# Patient Record
Sex: Female | Born: 1995 | Race: White | Hispanic: No | Marital: Single | Smoking: Never smoker
Health system: Southern US, Community
[De-identification: ages and names within clinical notes are randomized; demographics above are authoritative.]

## PROBLEM LIST (undated history)

## (undated) DIAGNOSIS — F71 Moderate intellectual disabilities: Secondary | ICD-10-CM

## (undated) DIAGNOSIS — R569 Unspecified convulsions: Secondary | ICD-10-CM

## (undated) DIAGNOSIS — F909 Attention-deficit hyperactivity disorder, unspecified type: Secondary | ICD-10-CM

---
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
## 2023-03-14 ENCOUNTER — Emergency Department (HOSPITAL_BASED_OUTPATIENT_CLINIC_OR_DEPARTMENT_OTHER)
Admission: EM | Admit: 2023-03-14 | Discharge: 2023-03-14 | Disposition: A | Discharge status: Home / Self Care | Payer: No Typology Code available for payment source | Attending: Emergency Medicine | Admitting: Emergency Medicine

## 2023-03-14 ENCOUNTER — Emergency Department (HOSPITAL_BASED_OUTPATIENT_CLINIC_OR_DEPARTMENT_OTHER): Payer: No Typology Code available for payment source

## 2023-03-14 ENCOUNTER — Other Ambulatory Visit: Payer: Self-pay

## 2023-03-14 DIAGNOSIS — M62838 Other muscle spasm: Secondary | ICD-10-CM | POA: Insufficient documentation

## 2023-03-14 DIAGNOSIS — Y92410 Unspecified street and highway as the place of occurrence of the external cause: Secondary | ICD-10-CM | POA: Insufficient documentation

## 2023-03-14 DIAGNOSIS — R519 Headache, unspecified: Secondary | ICD-10-CM | POA: Diagnosis present
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
## 2023-09-16 NOTE — Progress Notes (Signed)
 Medicare AWV  Carolyn Page is a 28 y.o. female who presents for her subsequent annual wellness visit for Medicare.  Clinical documentation was reviewed and is accessible via encounter-level attachments.    Any physical exam components or additional concerns beyond the scope of the Annual Wellness Visit may be documented in a separate note within this encounter.  Medicare Required Components     Reviewed and updated this visit by provider: Tobacco  Allergies  Meds  Problems  Med Hx  Surg Hx  Fam Hx       Substance Use Disorder Risk Statement: Low Substance Use Disorder / Overdose risk - Risk factors were reviewed and patient is low risk   WHISPER TEST: Normal bilaterally   Patient Care Team: Tylene KATHEE Meres, PA-C as PCP - General (Family Medicine) Mojeed A Akintayo as Consulting Physician (Psychiatry)  Vitals   Vitals:   09/16/23 0737  BP: 100/80  Patient Position: Sitting  Pulse: 76  Temp: 97.5 F (36.4 C)  TempSrc: Oral  Resp: 20  Height: 5' 3 (1.6 m)  Weight: 150 lb 3.2 oz (68.1 kg)  SpO2: 98%  BMI (Calculated): 26.6  PainSc: 0-No pain     General: The patient is a well-developed, well-nourished 28 y.o. female who appears to be in no acute distress. Alert and oriented x 3. HEENT: Normocephalic, atraumatic, non-icteric sclera, PERRL, EOMI. EACs are patent and normal. Tympanic membranes are clear bilaterally without perforation or infection.  Nasopharynx is grossly normal.  Oropharynx is without mass, erythema or exudate. Neck:  Supple. Trachea is in midline position. The neck is without adenopathy, masses, or thyromegaly. No carotid bruits audible. Lungs:  Good breath sounds are noted bilaterally. The lungs are clear to auscultation bilaterally without rales, rhonchi or wheezing.   Cardio:  Regular rate and rhythm without gallop, rub, or murmur. Normal S1, S2. Abdomen:  Soft and nontender to palpation.  No masses or organomegaly are noted.  Skin:  Warm and dry.  No rashes or worrisome lesions are noted.  Extremities:  The extremities are without cyanosis or significant contusions.  Pitting edema is not noted.  ROM is normal in all four extremities. Peripheral pulses are 2+ and symmetric in upper and lower extremities. Neuro:  Mental status is normal.  CN 2-11 are grossly normal.  Motor and sensory exams are grossly normal.  DTR's 2+ in all extremities.  Gait is stable. Psych: Her mood and affect are normal.  Disposition   1. Medicare annual wellness visit, subsequent (Primary) 2. Grand mal seizure (*) -     CMP14+CBC/D/Plt+TSH; Future 3. Microcephaly (*) -     CMP14+CBC/D/Plt+TSH; Future 4. Long-term use of high-risk medication -     CMP14+CBC/D/Plt+TSH; Future 5. Screening for deficiency anemia -     CMP14+CBC/D/Plt+TSH; Future 6. Screening for lipid disorders -     Lipid Panel With LDL/HDL Ratio; Future 7. Vitamin D deficiency -     Vitamin D 25 Hydroxy; Future 8. BMI 26.0-26.9,adult -     CMP14+CBC/D/Plt+TSH; Future    Health maintenance/health promotion issues appropriate to age reviewed. Immunizations are current. Recommend healthy diet and regular exercise. Recommend self breast exams.   Discussed pap recommendation with pt's caretaker and she does not feel that this is necessary as pt has never been sexually active - I agree. Recommend multivitamin daily and vitamin d supplementation. Cardiovascular screening with blood pressure and blood work (CMP, Lipid panel). Diabetes screening with fasting glucose in those patients who are not diabetic. Depression  Screening/Awareness.    Fasting blood work pending.   Continue current meds and follow-up with psych.  Encouraged healthy, low fat, low carb diet and regular exercise such as walking to keep BMI in healthy range.  I have reviewed the information contained in this note and personally verified its accuracy.  I obtained the history of present illness and personally performed the  physical exam.  Patient's caretaker verbalized to me that they understood what their problem is, what they need to do about it and why it is important that they do it.  Follow up for recheck as needed.   Health maintenance issues were discussed with the patient.  A written plan was provided to the patient in the form of patient instructions in the After Visit Summary document.

## 2024-05-23 ENCOUNTER — Ambulatory Visit (HOSPITAL_COMMUNITY): Admission: EM | Admit: 2024-05-23 | Discharge: 2024-05-23 | Disposition: A | Discharge status: Home / Self Care

## 2024-05-23 DIAGNOSIS — R4689 Other symptoms and signs involving appearance and behavior: Secondary | ICD-10-CM

## 2024-05-23 DIAGNOSIS — Z79899 Other long term (current) drug therapy: Secondary | ICD-10-CM | POA: Insufficient documentation

## 2024-05-23 DIAGNOSIS — R451 Restlessness and agitation: Secondary | ICD-10-CM | POA: Insufficient documentation

## 2024-05-23 DIAGNOSIS — F331 Major depressive disorder, recurrent, moderate: Secondary | ICD-10-CM | POA: Insufficient documentation

## 2024-05-23 DIAGNOSIS — F79 Unspecified intellectual disabilities: Secondary | ICD-10-CM | POA: Diagnosis present

## 2024-05-23 NOTE — ED Notes (Signed)
 Patient discharged home per MD order. After Visit Summary (AVS) printed and given to caregiver. AVS reviewed with patient and all questions fully answered. Patient discharged in no acute distress, A&O x4 and ambulatory. Patient denied SI/HI, A/VH upon discharge. Caregiver verbalized understanding of all discharge instructions explained by staff. Patient mood fair. Patient belongings returned to patient from locker complete and intact. Patient escorted to lobby via staff for transport to destination. Safety maintained.

## 2024-05-23 NOTE — BH Assessment (Addendum)
 Comprehensive Clinical Assessment (CCA) Note  05/23/2024 Carolyn Page [id number]  Disposition: Per Carolyn Snell, NP patient does not meet inpatient criteria.  She will continue with Day Treatment and f/u with Carolyn Page soon.   The patient demonstrates the following risk factors for suicide: Chronic risk factors for suicide include: psychiatric disorder of MDD and IDD. Acute risk factors for suicide include: social withdrawal/isolation and loss (financial, interpersonal, professional). Protective factors for this patient include: positive social support, positive therapeutic relationship, hope for the future, and life satisfaction. Considering these factors, the overall suicide risk at this point appears to be low. Patient is appropriate for outpatient follow up.  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 Patient presents with BHRT from her ALF after she refused to go to her Day Program today.  She has refused recently and endorses SI to try to avoid attending.  She denies current SI, and continually asks, Can I go home?  While denying SI, she does mention wanting to harm Carolyn Page.  She struggles to describe her relationship to Carolyn Page, at one point mentioning she works at her ALF and then describes her as another client at the [employer]  She has no plan or intent.  She shares she got upset because she did not want to go to [place] (Day Tx) and states he began screaming and yelling.  She indicates she pulled out a knife and she gestures as if she threatened others in the car, however this was not reported by BHRT or [employer]. Patient is IDD and struggles to engage in assessment.  Per BHRT,  AFL staff, Carolyn Page, is currently filing IVC paperwork.  Patient's ALF director, Carolyn Page, was interviewed.  She reports she has had guardianship of patient for  the past 15 years.  She states patient seems to have attachment issues, as she often struggles to leave her side when she takes patient to Day Treatment.  Carolyn believes this may happen more often when patient is aware of the other resident having plans with staff while patient is at the Day program.  Carolyn states she is tired of calling [employer], when patient refuses to go and acts out.  Carolyn states patient has no access to knives and did not have any knife in the car as patient reported.  She reports patient sees Carolyn Page for med management and she has been encouraged by provider to reach out to him regarding possible medication adjustments/ changes.  Carolyn also plans to work with the day program to assist her with the transition when she tries to drop patient off each day.    Provider spoke with patient's legal guardian, Carolyn Page [phone], who was hoping patient could be admitted.  He was informed by provider that patient does not meet criteria for admission.     Chief Complaint: No chief complaint on file.  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 Legal System  What Is the Reason for Your Visit/Call Today? Patient presents with BHRT from her ALF after she refused to go to her Day  Program today.  She has refused recently and endorses SI to try to avoid attending.  She denies current SI, and continually asks, Can I go home?  While denying SI, she does mention wanting to harm Carolyn Page.  She struggles to describe her relationship to Carolyn Page, at one point mentioning she works at her ALF and then describes her as another client at the [employer]  She has no plan or intent.  She shares she got upset because she did not want to go to [place] (Day Tx) and states he began screaming and yelling.  She indicates she pulled out a  knife and she gestures as if she threatened others in the car, however this was not reported by BHRT or [employer]PD. Patient is IDD and struggles to engage in assessment.  Per BHRT, Legal Guardian, Carolyn Page, is currently filing IVC paperwork.  How Long Has This Been Causing You Problems? 1 wk - 1 month  What Do You Feel Would Help You the Most Today? Treatment for Depression or other mood problem   Have You Recently Had Any Thoughts About Hurting Yourself? Yes  Are You Planning to Commit Suicide/Harm Yourself At This time? No   Flowsheet Row ED from 03/14/2023 in [facility] at [facility]  C-SSRS RISK CATEGORY No Risk    Have you Recently Had Thoughts About Hurting Someone [name]? Yes  Are You Planning to Harm Someone at This Time? No  Explanation: N/A   Have You Used Any Alcohol or Drugs in the Past 24 Hours? No  How Long Ago Did You Use Drugs or Alcohol? N/A What Did You Use and How Much? N/A  Do You Currently Have a Therapist/Psychiatrist? Yes  Name of Therapist/Psychiatrist: Name of Therapist/Psychiatrist: Patient is followed by Carolyn Page for med management.   Have You Been Recently Discharged From Any Office Practice or Programs? No  Explanation of Discharge From Practice/Program: N/A    CCA Screening Triage Referral Assessment Type of Contact: Face-to-Face  Telemedicine Service Delivery:   Is this Initial or Reassessment?   Date Telepsych consult ordered in [facility]:    Time Telepsych consult ordered in [facility]:    Location of Assessment: [facility] Services  Provider Location: [facility] Assessment Services   Collateral Involvement: Carolyn Page, caretaker (ALF owner) provided collateral.   Does Patient Have a Court Appointed Legal Guardian? Yes Other: (LG with [facility])  Legal Guardian Contact Information: Carolyn Page with [facility] [phone]  Copy of Legal Guardianship Form: No - copy requested  Legal  Guardian Notified of Arrival: Successfully notified  Legal Guardian Notified of Pending Discharge: Successfully notified  If Minor and Not Living with Parent(s), Who has Custody? N/A  Is CPS involved or ever been involved? In the Past  Is APS involved or ever been involved? Never   Patient Determined To Be At Risk for Harm To Self or Others Based on Review of Patient Reported Information or Presenting Complaint? No  Method: -- (N/A, no HI)  Availability of Means: -- (N/A, no HI)  Intent: -- (N/A, no HI)  Notification Required: -- (N/A, no HI)  Additional Information for Danger to Others Potential: -- (N/A, no HI)  Additional Comments for Danger to Others Potential: N/A, no HI  Are There Guns or Other Weapons in Your Home? No  Types of Guns/Weapons: N/A  Are These Weapons Safely Secured?                            -- (  N/A)  Who Could Verify You Are Able To Have These Secured: N/A  Do You Have any Outstanding Charges, Pending Court Dates, Parole/Probation? None  Contacted To Inform of Risk of Harm To Self or Others: Law Enforcement    Does Patient Present under Involuntary Commitment? Yes    [place] of Residence: [place]   Patient Currently Receiving the Following Services: Day Treatment; Medication Management   Determination of Need: Urgent (48 hours)   Options For Referral: Medication Management; Outpatient Therapy     CCA Biopsychosocial Patient Reported Schizophrenia/Schizoaffective Diagnosis in Past: No   Strengths: Patient has a supportive ALF staff and she is engaged in outpt services and Day Treatment.   Mental Health Symptoms Depression:  Irritability   Duration of Depressive symptoms: Duration of Depressive Symptoms: Greater than two weeks   Mania:  None   Anxiety:   Worrying; Tension   Psychosis:  None   Duration of Psychotic symptoms:    Trauma:  None   Obsessions:  None   Compulsions:  None   Inattention:  N/A    Hyperactivity/Impulsivity:  N/A   Oppositional/Defiant Behaviors:  Argumentative; Temper   Emotional Irregularity:  Mood lability   Other Mood/Personality Symptoms:  Low frustration tolerance consistent with IDD    Mental Status Exam Appearance and self-care  Stature:  Average   Weight:  Overweight   Clothing:  Casual   Grooming:  Normal   Cosmetic use:  None   Posture/gait:  Normal   Motor activity:  Not Remarkable   Sensorium  Attention:  Normal   Concentration:  Scattered   Orientation:  Person (d/t coginitive impairment)   Recall/memory:  Defective in Short-term (likely related to IDD)   Affect and Mood  Affect:  Appropriate   Mood:  -- (calm)   Relating  Eye contact:  Normal   Facial expression:  Responsive   Attitude toward examiner:  Cooperative   Thought and Language  Speech flow: Clear and Coherent   Thought content:  Appropriate to Mood and Circumstances   Preoccupation:  None   Hallucinations:  None   Organization:  Passenger transport manager of Knowledge:  Average   Intelligence:  Average   Abstraction:  Concrete   Judgement:  Impaired (secondary to cognitive impairment)   Reality Testing:  Unaware (cognitive impairment)   Insight:  Lacking; Poor (secondary cognitive impairment)   Decision Making:  Only simple   Social Functioning  Social Maturity:  Self-centered   Social Judgement:  Naive   Stress  Stressors:  Transitions   Coping Ability:  Exhausted   Skill Deficits:  Activities of daily living; Communication; Decision making; Intellect/education; Interpersonal; Self-control   Supports:  Family; Friends/Service system     Religion: Religion/Spirituality Are You A Religious Person?: No How Might This Affect Treatment?: N/A  Leisure/Recreation: Leisure / Recreation Do You Have Hobbies?: Yes Leisure and Hobbies: Patient states she likes to play with toys and with  dolls.  Exercise/Diet: Exercise/Diet Do You Exercise?: No Have You Gained or Lost A Significant Amount of Weight in the Past Six Months?: No Do You Follow a Special Diet?: No Do You Have Any Trouble Sleeping?: No   CCA Employment/Education Employment/Work Situation: Employment / Work Systems developer: On disability Why is Patient on Disability: IDD How Long has Patient Been on Disability: unknown Patient's Job has Been Impacted by Current Illness:  (n/a)  Education: Education Is Patient Currently Attending School?: No Last Grade Completed:  (UTA) Did  You Attend College?: No Did You Have An Individualized Education Program (IIEP): Yes Did You Have Any Difficulty At School?: Yes Were Any Medications Ever Prescribed For These Difficulties?: Yes Medications Prescribed For School Difficulties?: unknown Patient's Education Has Been Impacted by Current Illness: No   CCA Family/Childhood History Family and Relationship History: Family history Marital status: Single Does patient have children?: No  Childhood History:  Childhood History By whom was/is the patient raised?:  (Unknown, with foster care since age 61, now FP is ALF director for her ALF where pt resides) Did patient suffer any verbal/emotional/physical/sexual abuse as a child?: No Did patient suffer from severe childhood neglect?: Yes Patient description of severe childhood neglect: unknown, in foster care since at least age 29 per 55. Has patient ever been sexually abused/assaulted/raped as an adolescent or adult?: No Was the patient ever a victim of a crime or a disaster?:  (UTA) Witnessed domestic violence?: No Has patient been affected by domestic violence as an adult?: No       CCA Substance Use Alcohol/Drug Use: Alcohol / Drug Use Pain Medications: See MAR Prescriptions: See MAR Over the Counter: See MAR History of alcohol / drug use?: No history of alcohol / drug abuse                          ASAM's:  Six Dimensions of Multidimensional Assessment  Dimension 1:  Acute Intoxication and/or Withdrawal Potential:      Dimension 2:  Biomedical Conditions and Complications:      Dimension 3:  Emotional, Behavioral, or Cognitive Conditions and Complications:     Dimension 4:  Readiness to Change:     Dimension 5:  Relapse, Continued use, or Continued Problem Potential:     Dimension 6:  Recovery/Living Environment:     ASAM Severity Score:    ASAM Recommended Level of Treatment:     Substance use Disorder (SUD)    Recommendations for Services/Supports/Treatments:    Disposition Recommendation per psychiatric provider: There are no psychiatric contraindications to discharge at this time   DSM5 Diagnoses: Patient Active Problem List   Diagnosis Date Noted   Aggressive behavior 05/16/2014   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

## 2024-05-23 NOTE — ED Provider Notes (Signed)
 Behavioral Health Urgent Care Medical Screening Exam  Patient Name: Carolyn Page MRN: [id number] Date of Evaluation: 05/23/24 Chief Complaint:  SI & HI while at day program Diagnosis:  Final diagnoses:  Behavioral problems   History of Present illness: Carolyn Page is a 27 y.o. female with a history of IDD who presented to the [facility] accompanied by the behavioral health response team due to making threats to harm people at her day program.  Assessment: During assessment, patient denies suicidal ideations, denies homicidal ideations, denies auditory of visual hallucinations. Patient has no history of harming other people, and these behaviors are only happening at the day program as per collateral information obtained from the AFL provider Dorma Riding). AFL provider further denies that respondent had a knife or has access to a knife, and feels comfortable with patient returning home to her. She has been educated on the need to reach out to the outpatient psychiatrist (Dr Sable) for possible medication adjustments, as patient is on a lot of medications and there is a lot of room for adjustments. Medications include: Clonidine  0.1 mg 3 times daily, Lamictal  100 mg twice daily, naltrexone 50 mg twice daily, Inderal  80 mg daily, Zyprexa 15 mg twice daily, Saphris 5 mg twice daily, Topamax 100 mg twice daily.  List of medication is provided by ALF worker.   Safety planning has been completed with AFL provider, and she feels comfortable taking her back home. Respondent also has wrap around services already in place in the community, and will not benefit from an inpatient hospitalization type of setting at this time. Writer Educated AFL worker to return to this location should respondent's behaviors reoccur.  Writer educated patient's legal guardian regarding the fact that patient will not benefit from an inpatient hospitalization at this time, as per assessment completed.  He has been notified that  currently, patient is not presenting a danger to herself, or anybody else, as she is currently denying SI, denies HI, denies AVH.  Writer also educated on the fact that we cannot guarantee that behaviors were not reoccur, but in the event that they reoccur, to return patient back to this facility.    Patient currently has wraparound services, including one-on-one worker in the community, which is the highest level of care available in the community. Pt was petitioned for involuntary commitment by his AFL worker, but this has been rescinded as pt is currently not presenting a danger to herself or any one else in the community.  Safety planning completed with AFL worker-Stacey [phone]  Discussed methods to reduce the risk of self-injury or suicide attempts: Frequent conversations regarding unsafe thoughts. Locking/monitoring the use of all significant sharps, including knives, razor blades, pencil sharpener razors. If there is a firearm in the home, keeping the firearm unloaded, locking the firearm, locking the ammunition separately from the firearm, preventing access to the firearm and the ammunition. Locking/monitoring the use of medications, including over-the-counter medications and supplements. Having a responsible person dispense medications until patient has strengthened coping skills. Room checks for sharps or other harmful objects. Secure all chemical substances that can be ingested or inhaled. Securing any ligature risks. Calling 911/EMS or going to the nearest emergency room for any worsening of condition.   [place] Suicide Risk assessment:  1. Do you wish to be dead? NO 2. Have you wished your dead or wished you could go to sleep and not wake up?  NO 3.  Have you actually had thoughts of killing yourself?  NO 4.  Have you been thinking about how you might do this?   NO 5.  Have you had these thoughts and some intention of acting on them?  NO 6.  Have you started to work out or  worked out the details to kill yourself?  NO 7.  Do you intend to carry out this plan? NO 8. On a scale of 1-5 with 1 being the least severe and 5 being the most severe answer the following questions place for intensity of ideation. ZERO 9. How many times have you had these thoughts? NO 10. When you have the thoughts how long to the last?  NO 11. Control ability.  Could you or can you stop thinking about killing herself or wanting to die if you want to?  YES 12. Are there any things anyone or anything family religion pain of death that stop you from wanting to die or acting on thoughts of committing suicide?  FAMILY 13.  What sort of reason to do have to think about wanting to die or killing yourself? NONE  14. Have you done anything, started to do anything,  or prepared to do anything to end your life? Examples: Took pills, tried to shoot yourself, cut yourself, tried to hang yourself,  took out pills but didn't swallow any, held a gun but changed your mind or it was  grabbed from your hand, went to the roof but didn't jump, collected pills, obtained  a gun, gave away valuables, wrote a will or suicide note, etc. NO.  Suicide Risk: Minimal: No identifiable suicidal ideation.  Patients presenting with mild risk factors but with morbid ruminations; may be classified as minimal risk based on the severity of the depressive symptoms. Denies past suicide attempts, none per AFL worker, no past mental health related hospitalizations.  Flowsheet Row ED from 03/14/2023 in [facility] Emergency Department at [facility]  C-SSRS RISK CATEGORY No Risk    Psychiatric Specialty Exam  Presentation  General Appearance:Fairly Groomed  Eye Contact:Fair  Speech:Clear and Coherent  Speech Volume:Normal  Handedness:Right  Mood and Affect  Mood:Depressed; Anxious  Affect:Congruent   Thought Process  Thought Processes:Coherent  Descriptions of Associations:Intact  Orientation:Full (Time,  Place and Person)  Thought Content:Logical  Diagnosis of Schizophrenia or Schizoaffective disorder in past: No   Hallucinations:None  Ideas of Reference:None  Suicidal Thoughts:No  Homicidal Thoughts:No   Sensorium  Memory:Immediate Fair  Judgment:Fair  Insight:Fair  Executive Functions  Concentration:Fair  Attention Span:Fair  Recall:Fair  Fund of Knowledge:Fair  Language:Fair  Psychomotor Activity  Psychomotor Activity:Normal  Assets  Assets:Resilience  Sleep  Sleep:Fair  Number of hours: No data recorded  Physical Exam: Physical Exam Vitals and nursing note reviewed.    Review of Systems  Psychiatric/Behavioral:  Negative for depression, hallucinations, memory loss, substance abuse and suicidal ideas. The patient is not nervous/anxious and does not have insomnia.   All other systems reviewed and are negative.  Blood pressure 109/69, pulse 72, temperature 98.6 F (37 C), temperature source Axillary, resp. rate 16, SpO2 99%. There is no height or weight on file to calculate BMI.  Musculoskeletal: Strength & Muscle Tone: within normal limits Gait & Station: normal Patient leans: N/A   [facility] MSE Discharge Disposition for Follow up and Recommendations: Based on my evaluation the patient does not appear to have an emergency medical condition and can be discharged with resources and follow up care in outpatient services for Medication Management and Individual Therapy  Educated to follow  up with her mental health provider-Dr. Akintayo.  Donia Snell, NP 05/23/2024, 1:12 PM

## 2024-05-23 NOTE — Discharge Instructions (Signed)
